# Patient Record
Sex: Female | Born: 1980 | Hispanic: Yes | Smoking: Never smoker
Health system: Southern US, Community
[De-identification: ages and names within clinical notes are randomized; demographics above are authoritative.]

## PROBLEM LIST (undated history)

## (undated) DIAGNOSIS — K219 Gastro-esophageal reflux disease without esophagitis: Secondary | ICD-10-CM

## (undated) DIAGNOSIS — D649 Anemia, unspecified: Secondary | ICD-10-CM

## (undated) HISTORY — DX: Gastro-esophageal reflux disease without esophagitis: K21.9

## (undated) HISTORY — PX: NO PAST SURGERIES: SHX2092

## (undated) HISTORY — DX: Anemia, unspecified: D64.9

---

## 2021-10-30 DIAGNOSIS — Z3A22 22 weeks gestation of pregnancy: Secondary | ICD-10-CM

## 2021-10-30 DIAGNOSIS — Z363 Encounter for antenatal screening for malformations: Secondary | ICD-10-CM

## 2021-10-30 DIAGNOSIS — O09522 Supervision of elderly multigravida, second trimester: Secondary | ICD-10-CM

## 2021-11-02 DIAGNOSIS — Z363 Encounter for antenatal screening for malformations: Secondary | ICD-10-CM | POA: Insufficient documentation

## 2021-11-02 DIAGNOSIS — O09522 Supervision of elderly multigravida, second trimester: Secondary | ICD-10-CM | POA: Insufficient documentation

## 2021-11-02 DIAGNOSIS — Z362 Encounter for other antenatal screening follow-up: Secondary | ICD-10-CM

## 2021-11-02 DIAGNOSIS — Z3689 Encounter for other specified antenatal screening: Secondary | ICD-10-CM

## 2021-11-02 DIAGNOSIS — O365921 Maternal care for other known or suspected poor fetal growth, second trimester, fetus 1: Secondary | ICD-10-CM

## 2021-11-02 DIAGNOSIS — Z3A22 22 weeks gestation of pregnancy: Secondary | ICD-10-CM | POA: Insufficient documentation

## 2021-11-02 NOTE — Progress Notes (Signed)
Name: Lisa Holloway Indication: Advanced Maternal Age  DOB: 1981/01/14 Age: 41 y.o.   EDC: 03/05/2022 LMP: 05/29/2021 Referring Provider:  Marcy Salvo*  EGA: [redacted]w[redacted]d Genetic Counselor: Teena Dunk, MS, CGC  OB Hx: [id number] Date of Appointment: 11/02/2021  Accompanied by: Spanish interpreter Face to Face Time: 30 Minutes   Previous Testing Completed: CBC from 10/23/2021 reviewed. MCV within normal limits. It is unlikely that Lisa Holloway is a beta thalassemia carrier or an alpha thalassemia carrier of the double-gene deletion. Individuals with a normal MCV may be single-gene deletion carriers, but it is unlikely that the current pregnancy would be affected with alpha or beta thalassemia major.  Lisa Holloway previously completed Non-Invasive Prenatal Screening (NIPS) in this pregnancy. The result is low risk. This screening significantly reduces the risk that the current pregnancy has Down syndrome, Trisomy 69, Trisomy 62, and common sex chromosome conditions, however, the risk is not zero given the limitations of NIPS. Additionally, there are many genetic conditions that cannot be detected by NIPS.    Medical & Family History:  This is Lisa Holloway's 3rd pregnancy. She has 2 living children from a previous partner. Reports she takes folic acid and prenatal vitamins. Denies personal history of diabetes, high blood pressure, thyroid conditions, and seizures. Denies bleeding, infections, and fevers in this pregnancy. Denies using tobacco, alcohol, or street drugs in this pregnancy. Maternal ethnicity reported as Latino and paternal ethnicity reported as Latino. Denies consanguinity. Lisa Holloway denied a family history of chromosome conditions, intellectual disability, autism, birth defects, bone/skeletal disorders, blindness, deafness, blood disorders, neuromuscular disorders, still births, early infant deaths, and 3 or more pregnancy losses for one person on her prenatal intake questionnaire.      Genetic  Counseling:   Advanced Maternal Age. With delivery at 40 years, Lisa Holloway's age-related risk to have a baby with Down syndrome is 1/133 and risk to have a baby with any chromosome condition is 1/70. Lisa Holloway previously completed Non-Invasive Prenatal Screening (NIPS) in this pregnancy. The result is low risk. This screening significantly reduces the risk that the current pregnancy has Down syndrome, Trisomy 86, Trisomy 38, and common sex chromosome conditions, however, the risk is not zero given the limitations of NIPS. Additionally, there are many genetic conditions that cannot be detected by NIPS. Given the limitations of NIPS genetic counseling offered Lisa Holloway the option of amniocentesis for prenatal diagnosis. Lisa Holloway declined amniocentesis for prenatal diagnosis at this time.   Birth Defects. All babies have approximately a 3-5% risk for a birth defect and a majority of these defects cannot be detected through the screening or diagnostic testing listed below. Ultrasound may detect some birth defects, but it may not detect all birth defects. About half of pregnancies with Down syndrome do not show any soft markers on ultrasound. A normal ultrasound does not guarantee a healthy pregnancy.    Testing/Screening Options:   Amniocentesis. This procedure is available for prenatal diagnosis. Possible procedural difficulties and complications that can arise include maternal infection, cramping, bleeding, fluid leakage, and/or pregnancy loss. The risk for pregnancy loss with an amniocentesis is 1/500. Per the [employer] of [employer]stetric[employer]necologists (ACOG) Practice Bulletin 162, all pregnant women should be offered prenatal assessment for aneuploidy by diagnostic testing regardless of maternal age or other risk factors. If indicated, genetic testing that could be ordered on an amniocentesis sample includes a fetal karyotype, fetal microarray, and testing for specific syndromes.  Carrier screening. Per the  ACOG Committee Opinion 691, all women who are considering a pregnancy  or are currently pregnant should be offered carrier screening for, at minimum, Cystic Fibrosis (CF), Spinal Muscular Atrophy (SMA), and Hemoglobinopathies. The mode of inheritance, clinical manifestations of these conditions, as well as details about testing were reviewed. A negative result on carrier screening reduces the likelihood of being a carrier, however, does not entirely rule out the possibility. If Lisa Holloway was found to be a carrier for a specific condition, carrier screening for their reproductive partner would be recommended.     Patient Plan:  Proceed with: Routine prenatal care. Lisa Holloway had carrier screening drawn through her OB office. The results are still pending through the laboratory [facility]. Informed consent was obtained. All questions were answered.  Declined: Amniocentesis for prenatal diagnosis   Thank you for sharing in the care of Lisa Holloway with [place].  Please do not hesitate to contact us if you have any questions.  Teena Dunk, MS, [facility]

## 2021-11-08 DIAGNOSIS — Z3490 Encounter for supervision of normal pregnancy, unspecified, unspecified trimester: Secondary | ICD-10-CM

## 2021-11-08 DIAGNOSIS — Z8759 Personal history of other complications of pregnancy, childbirth and the puerperium: Secondary | ICD-10-CM

## 2021-11-08 NOTE — Progress Notes (Signed)
°  Subjective:  Lisa Holloway is a [id number] [redacted]w[redacted]d being seen today for her first obstetrical visit - she was initially seen at the [facility] and transferred here due to prior pregnancy with SGA - last baby was born at 73 weeks (spontaneous labor) 4#8oz. Her last pregnancy was 15 years ago. Patient does intend to breast feed. Pregnancy history fully reviewed.  Patient reports no complaints.  BP 120/79    Pulse (!) 104    Wt 149 lb 6.4 oz (67.8 kg)    LMP 05/29/2021    BMI 30.11 kg/m   HISTORY: OB History  Gravida Para Term Preterm AB Living  3 2 2  0   2  SAB IAB Ectopic Multiple Live Births          2    # Outcome Date GA Lbr Len/2nd Weight Sex Delivery Anes PTL Lv  3 Current           2 Term 02/27/07 [redacted]w[redacted]d  4 lb 8 oz (2.041 kg) F Vag-Spont  Y   1 Term 09/02/03 [redacted]w[redacted]d  7 lb (3.175 kg) M Vag-Spont None N LIV    Past Medical History:  Diagnosis Date   Anemia    GERD (gastroesophageal reflux disease)     Past Surgical History:  Procedure Laterality Date   NO PAST SURGERIES      Family History  Problem Relation Age of Onset   Uterine cancer Mother    Hypertension Father      Exam  BP 120/79    Pulse (!) 104    Wt 149 lb 6.4 oz (67.8 kg)    LMP 05/29/2021    BMI 30.11 kg/m   Chaperone present during exam  CONSTITUTIONAL: Well-developed, well-nourished female in no acute distress.  HENT:  Normocephalic, atraumatic, External right and left ear normal. Oropharynx is clear and moist EYES: Conjunctivae and EOM are normal. Pupils are equal, round, and reactive to light. No scleral icterus.  NECK: Normal range of motion, supple, no masses.  Normal thyroid.  CARDIOVASCULAR: Normal heart rate noted, regular rhythm RESPIRATORY: Clear to auscultation bilaterally. Effort and breath sounds normal, no problems with respiration noted. ABDOMEN: Soft, normal bowel sounds, no distention noted.  No tenderness, rebound or guarding.  MUSCULOSKELETAL: Normal range of motion. No tenderness.  No  cyanosis, clubbing, or edema.  2+ distal pulses. SKIN: Skin is warm and dry. No rash noted. Not diaphoretic. No erythema. No pallor. NEUROLOGIC: Alert and oriented to person, place, and time. Normal reflexes, muscle tone coordination. No cranial nerve deficit noted. PSYCHIATRIC: Normal mood and affect. Normal behavior. Normal judgment and thought content.    Assessment:    Pregnancy: 05/31/2021 Patient Active Problem List   Diagnosis Date Noted   Supervision of low-risk pregnancy 11/08/2021      Plan:   1. Encounter for supervision of low-risk pregnancy, antepartum FHT and FH normal. Start ASA 81mg . - CHL AMB BABYSCRIPTS SCHEDULE OPTIMIZATION  2. History of prior pregnancy with SGA newborn 01/08/2022 q 4-6 weeks.    Problem list reviewed and updated. 75% of 30 min visit spent on counseling and coordination of care.     11/08/2021

## 2021-11-08 NOTE — Patient Instructions (Signed)
AREA PEDIATRIC/FAMILY PRACTICE PHYSICIANS ° °[place] ([address]) °[facility] °Chambliss, MD; Eniola, MD; Hale, MD; Hensel, MD; McDiarmid, MD; McIntyer, MD; Neal, MD; Walden, MD °[address] [place], [place] [address] °[phone] °Mon-Fri 8:30-12:30, 1:30-5:00 °Providers come to see babies at [facility] °Accepting Medicaid °[facility] °Limited providers who accept newborns: Koirala, MD; Morrow, MD; Wolters, MD °[address], [place], [place] [address] °[phone] °Mon-Fri 8:00-5:30 °Babies seen by providers at [facility] °Does NOT accept Medicaid °Please call early in hospitalization for appointment (limited availability)  °[facility] °Mulberry, MD °[address] [place], [place] [address] °[phone] °Mon, Tue, Thur, Fri 8:30-5:00, Wed 10:00-7:00 (closed 1-2pm) °Babies seen by [facility] providers °Accepting Medicaid °[facility] °Rubin, MD °[address], [place], [place] [address] °[phone] °Mon-Fri 8:30-5:00, Sat 8:30-12:00 °Provider comes to see babies at [facility] °Accepting Medicaid °Must have been referred from current patients or contacted office prior to delivery °[facility] ([facility]) °Brown, MD; Chandler, MD; Ettefagh, MD; Grant, MD; Lester, MD; McCormick, MD; McQueen, MD; Prose, MD; Simha, MD; Stanley, MD; Stryffeler, NP; Tebben, NP °[address], [place], [place] [address] °[phone] °Mon, Tue, Thur, Fri 8:30-5:30, Wed 9:30-5:30, Sat 8:30-12:30 °Babies seen by [facility] providers °Accepting Medicaid °Only accepting infants of first-time parents or siblings of current patients °Hospital discharge coordinator will make follow-up appointment °Jack Amos °[address], [place], [place]  [address] °[phone]   Fax - [phone] °[facility] °[address], [place], [place]  [address] °Phone - [phone]   Fax - [phone] °Shilpa Gosrani °[address], [place], [place]  [address] °[phone] ° °[place] ([address]) °[facility] °Bates, MD; Brassfield, MD; Cooper, Cox, MD; MD; Davis, MD; Dovico, MD; Ettefaugh, MD; Little, MD; Lowe, MD; Keiffer, MD; Melvin, MD; Sumner, MD; Williams, MD °[address], [place], [place] [address] °[phone] °Mon-Fri 8:30-5:00 (extended evenings Mon-Thur as needed), Sat-Sun 10:00-1:00 °Providers come to see babies at [facility] °Accepting Medicaid for families of first-time babies and families with all children in the household age 3 and under. Must register with office prior to making appointment (M-F only). °[facility] °Henson, NP; Knapp, MD; Lalonde, MD; Tysinger, PA °[address] [place], [place] [address] °[phone] °Mon-Fri 8:00-5:00 °Babies seen by providers at [facility] °Does NOT accept Medicaid/Commercial Insurance Only °[facility] ([facility]  °Artis, MD; Barnes, MD; Bratton, MD; Coccaro, MD; Lockett Gardner, MD; Kramer, MD; Marshall, MD; Netherton, MD; Poleto, MD; Skinner, MD °[address] [place], [place] [address] °[phone] °Mon-Fri 8:30-5:30, Sat (Oct.-Mar.) 9:00-1:00 °Babies seen by providers at [facility] °Accepting Medicaid ° °[place] ([address]) °[facility] °Reid, MD; Warner, MD °[address], [place], [place] [address] °[phone] °Mon-Fri 8:30-5:00, Sat 8:30-12:00 °Providers come to see babies at [facility] °Does NOT accept Medicaid °[facility] °Becker, PA; Hagler, MD; Scifres, PA; Sun, MD; Swayne, MD °[address], [place], [place] [address] °[phone] °Mon-Fri 8:00-5:00 °Babies seen by providers at [facility] °Does NOT accept Medicaid °Only accepting babies of parents who  are patients °Please call early in hospitalization for appointment (limited availability) °[facility] °Clark, MD; Frye, MD; Kelleher, MD; Mack, NP; Miller, MD; O'Keller, MD; Patterson, NP; Pudlo, MD; Puzio, MD; Thomas, MD; Tucker, MD; Twiselton, MD °[address], [place], [place] [address] °[phone] °Mon-Fri 8:00-5:00, Sat 9:00-12:00 °Providers come to see babies at [facility] °Does NOT accept Medicaid ° °[place] ([address]) °[facility] °Limited providers accepting new patients: Brake, NP; Wharton, PA °[address], [place], [place] [address] °[phone] °Mon-Fri 8:00-5:00 °Babies seen by providers at [facility] °Does NOT accept Medicaid °Only accepting babies of parents who are patients °Please call early in hospitalization for appointment (limited availability) °[facility] °Gay, MD; Quinlan, MD °[address] [place], [place] [address] °[phone] (press 1 to schedule appointment) °Mon-Fri 8:00-5:00 °Providers come to see babies at [facility] °Does NOT accept Medicaid °[facility] °Mazer, MD °[address] [place], [place] [address] °[phone] °Mon-Fri 8:30-5:00 (lunch 12:30-1:00), extended hours by appointment only Wed 5:00-6:30 °Babies seen by [facility] providers °Accepting Medicaid °[facility] °Banks, MD; Jordan, MD; Koberlein, MD °[address], [place], [place] [address] °[phone] °Mon-Fri 8:00-5:00 °Babies seen by [facility] providers °Does NOT accept Medicaid °[facility] °Parker, MD; Hunter, MD; Wallace, DO °[address] [place], [place] [address] °[phone] °Mon-Fri 8:00-5:00 °Babies seen by [facility] providers °Does NOT accept Medicaid °[facility] °Brandon, PA; Brecken, PA; Christy, NP; Dees, MD; DeClaire, MD; DeWeese, MD; Hansen, NP; Mills, NP; Parrish, NP; Smoot, NP; Summer, MD; Vapne,  MD °[address] [place], [place] [address] °[phone] °Mon-Fri 8:30-5:00, Sat 10:00-1:00 °Providers come to see babies at [facility] °Does NOT accept Medicaid °Free prenatal information session Tuesdays at 4:45pm °[facility] °Bouska, MD; Gordon, PA; Jeffery, PA; Weber, PA °[address] [place] [address] °[phone] °Mon-Fri 7:30-5:30 °Babies seen by [facility] providers °[facility] °[address], [place], [place]  [address] °[phone]   Fax - [phone] ° °[place] ([address]) °[facility] °Reese, MD °[address] [place], [place] [address] °[phone] °Mon-Thur 8:00-6:00 °Providers come to see babies at [facility] °Accepting Medicaid °[facility] °Anderson, NP; Badger, MD; Beal, PA; Spencer, PA °[address] [place], [place] [address] °[phone] °Mon-Thur 7:30-7:30, Fri 7:30-4:30 °Babies seen by [facility] providers °Accepting Medicaid °[facility] °Agbuya, MD; Klett, NP; Romgoolam, MD °[address], [place], [place] [address] °[phone] °Mon-Fri 8:30-5:00, Sat 8:30-12:00 °Providers come to see babies at [facility] °Accepting Medicaid °Must have “Meet & Greet” appointment at office prior to delivery °[facility] ([facility]) °McCord, MD; Wallace, MD; Wood, MD °[address], [place], [place] [address] °[phone] °Mon-Wed 8:00-6:00, Thur-Fri 8:00-5:00, Sat 9:00-12:00 °Providers come to see babies at [facility] °Does NOT accept Medicaid °Only accepting siblings of current patients °[facility]  °[address], [place], [place]  [address] °[phone]   Fax - [phone] °[facility] °[address], [place], [place]  [address] °[phone]   Fax -  [phone] ° °[place] ([address]) ° [facility] °Cirigliano, DO; Matthews, DO °[address] [place], [place] [address] °[phone] °Mon-Fri 7:00-5:00 °Babies seen by [facility] providers °Does NOT accept Medicaid °[facility] °Briscoe, MD; Howley, PA; Moreira, PA °[address], [place],  [address] °[phone] °Mon-Fri 8:00-5:00 °Babies seen by [facility] providers °Accepting Medicaid °[facility] °Boyd, MD; Church, PA; Jones, NP; Osborn, PA °[address], ,  [address] °[phone] °Mon-Fri 8:00-5:00 °Babies seen by providers at [facility] °Accepting Medicaid ° °[place] ([address]) °[facility] [place] °Wendling, DO °[address] [place], [place] [address] °[phone] °Mon-Fri 8:00-5:00 °Babies seen by [facility] providers °Does NOT accept Medicaid °Limited availability, please call early in hospitalization to schedule follow-up °[facility] °Calderon, PA; Cummings, MD; Dillard, MD; Martin, PA; Olson, MD; VanDeven, PA °[address], [place], [place] [address] °[phone] °Mon-Fri 8:30-5:00, Sat 9:00-12:00 °Babies seen by providers at [facility] °Accepting Medicaid °Please register online then schedule online or call office °[email or web address] °[facility] ([facility] °Hunter, NP; Kumar, MD; Martin Rogers, PA °[address], [place], [place] [address] °[phone] °Mon-Fri 8:00-5:00 °Babies seen by providers at [facility] °Accepting Medicaid °[facility] ([facility]) °Wanamie, MD; Kristi Fleenor, NP; West, MD °[address], [place], [place] [address] °[phone] °Mon-Fri 8:00-5:30, Sat&Sun by appointment (phones open at  8:30) °Babies seen by [facility] providers °Accepting Medicaid °Must be a first-time baby or sibling of current patient °[facility] [place]  °[address], [place], [place]  [address] °[phone]   Fax - [phone] ° °[place] ([address]) °[facility] °Brown, PA; Cowen, PA; Rice, MD; Helton, PA; Spry, MD °[address] [place], [place] [address] °[phone] °Mon-Thur 8:00-7:00, Fri 8:00-5:00, Sat 8:00-12:00, Sun 9:00-12:00 °Babies seen by [facility] providers °Accepting Medicaid °[facility] °Coe-Goins, MD; Marshall, MD; Pierre-Louis, MD °[address], [place], [place] [address] °[phone] °Mon-Thur 8:00-5:00 °Babies seen by providers at [facility] °Accepting Medicaid °[facility] °Bratton, MD; Coe-Goins, MD; Hayes, MD; Lewis, MD; List, MD; Lott, MD; Marshall, MD; Moran, MD; O'Neal, MD; Pierre-Louis, MD; Pitonzo, MD; Scholer, MD; Spangle, MD °[address] [place], [place] [address] °[phone] °Mon-Fri 8:00-5:30, Sat (Oct.-Mar.) 9:00-1:00 °Babies seen by providers at [facility] °Accepting Medicaid °Must fill out new patient packet, available online at [email or web address] °[facility] ([facility]) °Friddle, NP; Harris, NP; Kelly, NP; Logan, MD; Melvin, PA; Poth, MD; Ramadoss, MD; Stanton, NP °[address], [place], [place] [address] °[phone] °Mon-Thur 8:00-5:30, Fri 8:00-5:00 °Babies seen by providers at [facility] °Accepting Medicaid ° °[place] ([address]) °[facility] °Dixon, PA; Eldridge, MD; Pickard, MD; Tapia, PA °[address], [place], [place] [address] °[phone] °Mon-Fri 8:00-5:00 °Babies seen by providers at [facility] °Accepting Medicaid  ° °[place] ([address]) °[facility] [place] °Masneri, DO; Meyers, MD; Nelson, PA °[address], [place], [place] [address] °[phone] °Mon-Fri 8:00-5:00 °Babies seen by providers at [facility] °Does NOT accept Medicaid °Limited appointment availability, please call early in hospitalization ° °[facility] [place] °Kunedd, DO; McGowen, MD °[address], [place], [place] [address] °[phone] °Mon-Fri 8:00-5:00 °Babies seen by [facility] providers °Does NOT accept Medicaid °[facility] [place] °Cameron, MD; MacDonald, MD; Michaels, PA; Nayak, MD °[address] [place] [address], [place], [place] [address] °[phone] °Mon-Fri 8:00-5:00 °After hours clinic ([address] [place], [place] [address]) [phone] Mon-Fri 5:00-8:00, Sat 12:00-6:00, Sun 10:00-4:00 °Babies seen by [facility] providers °Accepting Medicaid °[facility] [place] °[address], [place], [place]  [address] °[phone]   Fax - [phone] ° °[place] ([address]) °[facility] [place] [facility] °Andy, MD °[address], [place], [place] [address] °[phone] °Mon-Fri 8:00-5:00 °Babies seen by [facility] providers °Does NOT accept Medicaid °[facility] [place] ([facility] [place]) °Eksir, MD °[address], [place], [place] [address] °[phone] °Mon-Thur 8:00-7:00, Fri 8:00-5:00, Sat 8:00-12:00 °Babies seen by providers at [facility] °Accepting Medicaid - but does not have vaccinations in office (must be received elsewhere) °Limited availability, please call early in hospitalization ° °[place] ([address]) °[facility]  °Charlene Flemming, MD °[address], [place] [address] °[phone]  Fax [phone] ° °[place] °[facility]  °[facility]  °Kimberly Newton, MD, Annamarie Streilein, PA, Carla Hampton, PA °[address] [place] °[place], [place]  [address] °[phone] °[facility]  °[address], [place], [place] [address] °[phone] °[address], [place], [place] [address] °[phone] (West Office)  °[facility] °[address], [place], [place] [address] °[phone] °[facility] °[address] [place], [place], [place] [address] °[phone] °[facility] °[address], [place], [place] [address] °[phone] °[facility] °[address], [place], [place] [address] °[phone] °[facility] °[address], [place], [place] [address] °[phone] °[facility] °[address], [place], [place] [address] °[phone] °[facility]  °[address], [place], [place] [address] °[phone] °Dr. Robert W. Little °[address], [place], [place] [address] °[phone] °[facility] °[address], [place], [place] [address] °[phone] °[facility] °[address], [place], [place] [address] °[phone]  °

## 2023-03-13 IMAGING — US US MFM OB FOLLOW-UP
1 series · 13 of 28 positions shown · non-contrast
Comparison: none

[Series 1: us mfm ob follow-up · 13 of 52 slices shown]
[im 2/52]
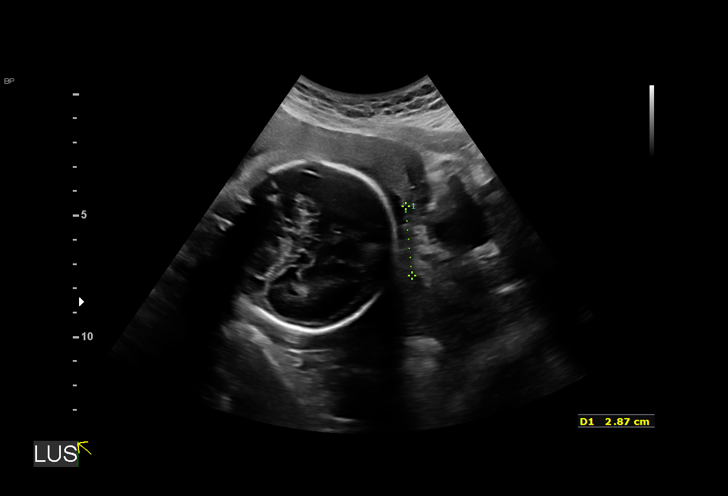
[im 6/52]
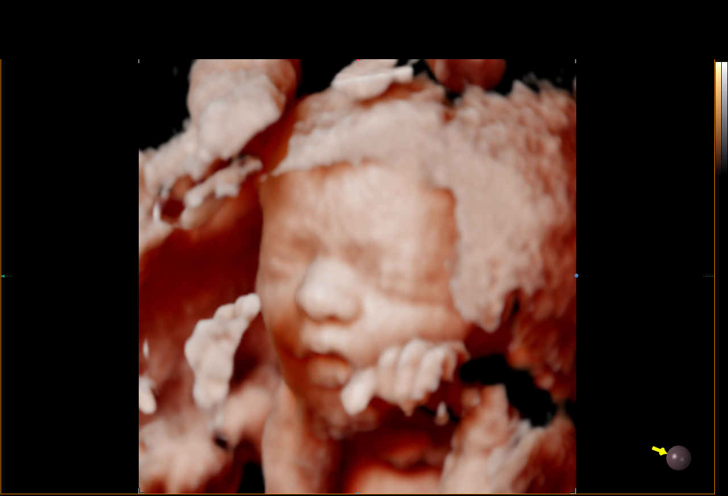
[im 10/52]
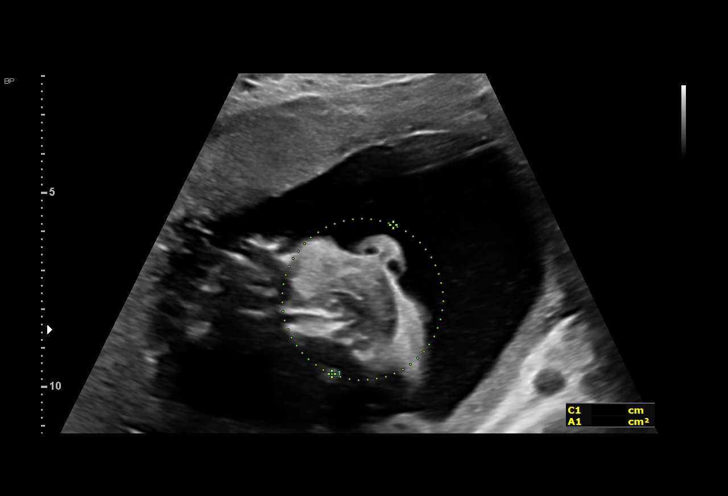
[im 14/52]
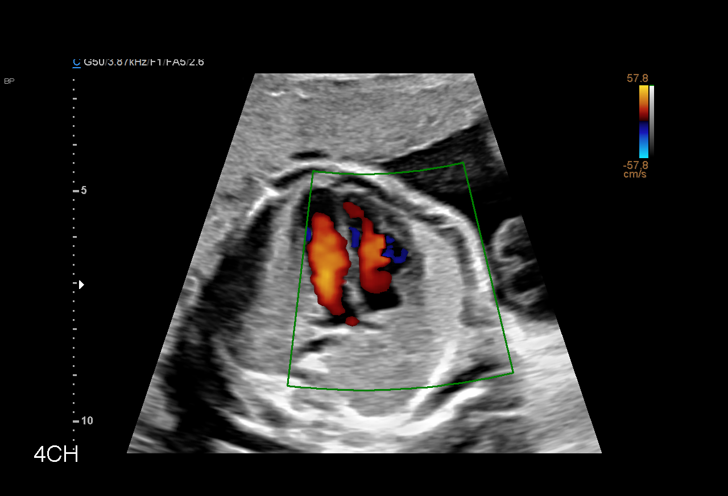
[im 18/52]
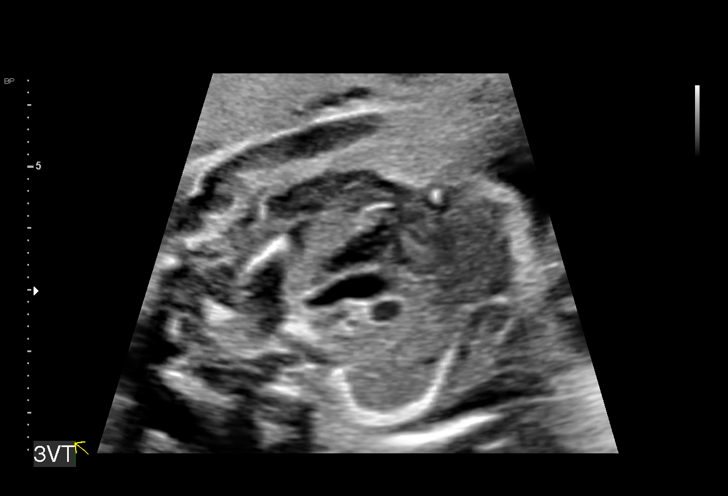
[im 21/52]
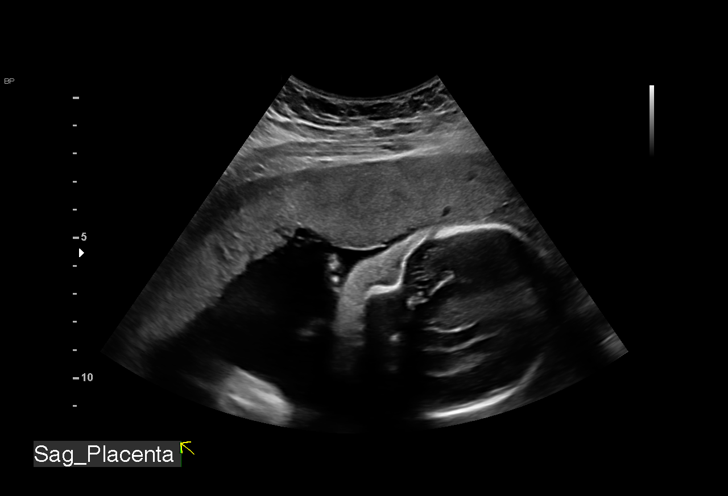
[im 27/52]
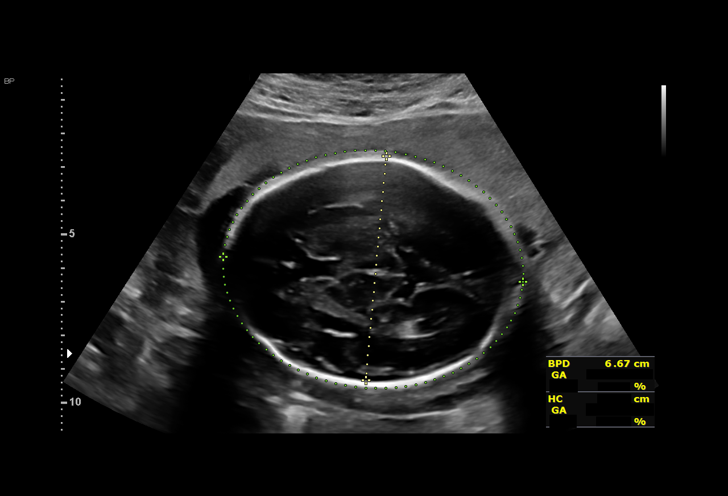
[im 31/52]
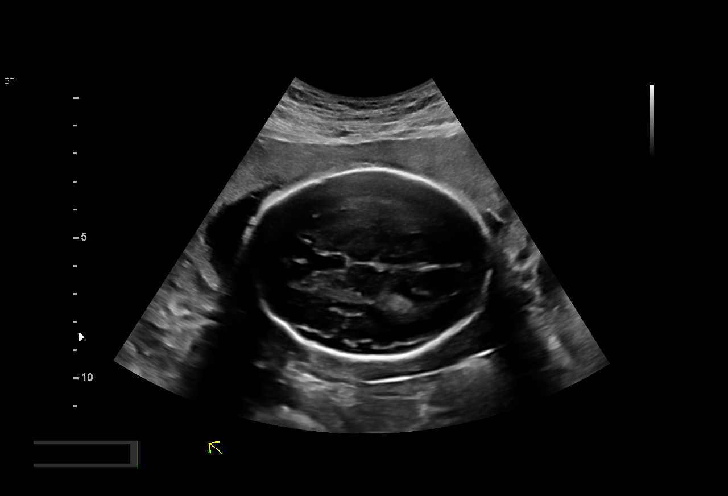
[im 35/52]
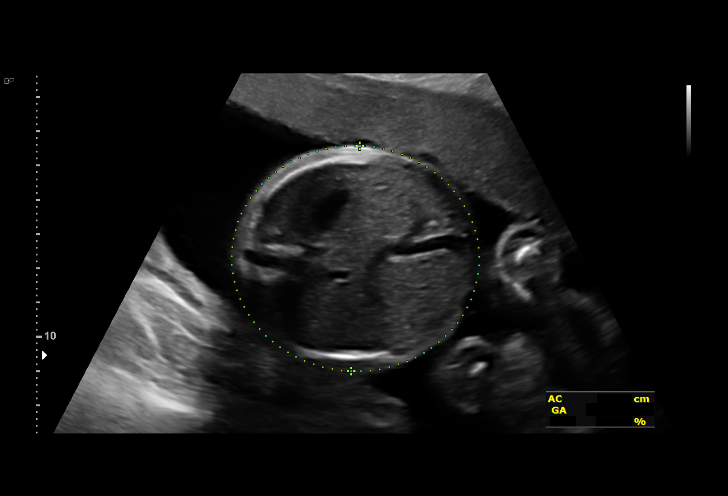
[im 38/52]
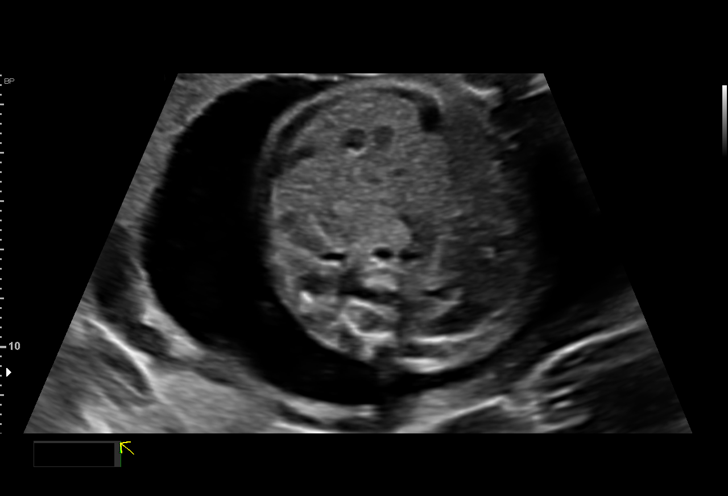
[im 42/52]
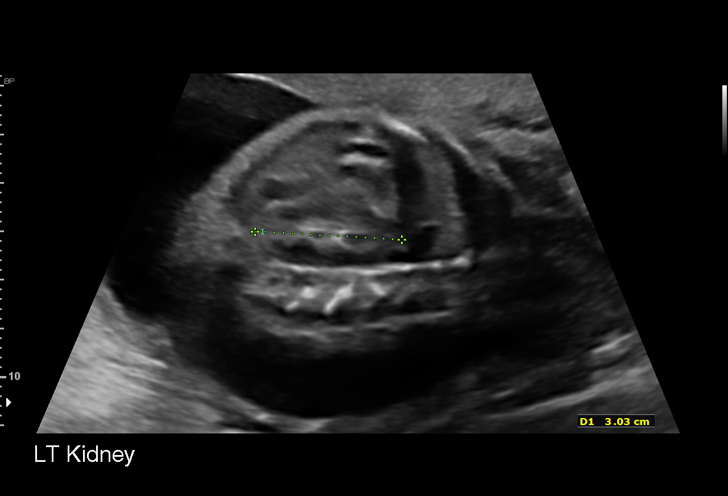
[im 46/52]
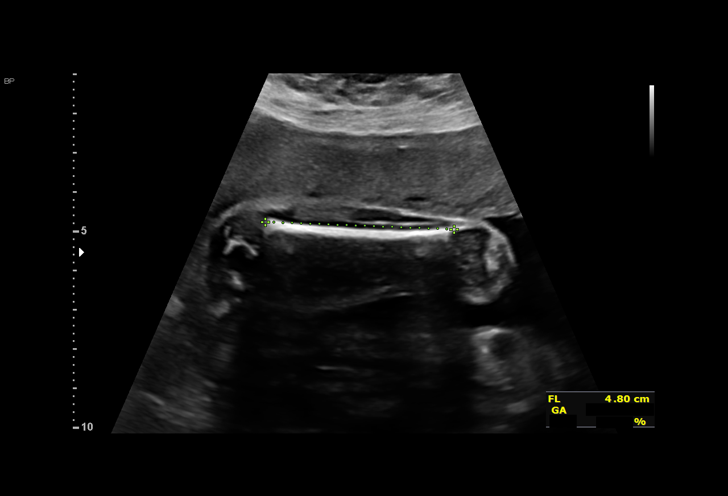
[im 50/52]
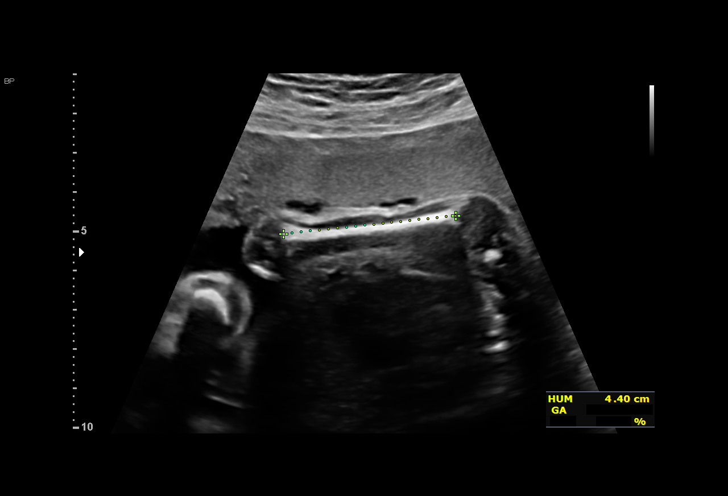

[13 of 28 positions shown; findings below may reference images not displayed]

Indications

 Advanced maternal age multigravida 35+,
 second trimester (40 y.o)
 Poor obstetric history: Previous fetal growth
 restriction (FGR)
 LR NIPS
 26 weeks gestation of pregnancy
Fetal Evaluation

 Num Of Fetuses:         1
 Fetal Heart Rate(bpm):  153
 Cardiac Activity:       Observed
 Presentation:           Cephalic
 Placenta:               Anterior
 P. Cord Insertion:      Visualized, central

 Amniotic Fluid
 AFI FV:      Within normal limits

                             Largest Pocket(cm)

Biometry

 BPD:      66.9  mm     G. Age:  27w 0d         58  %    CI:        71.76   %    70 - 86
                                                         FL/HC:      19.1   %    18.6 -
 HC:      251.4  mm     G. Age:  27w 2d         54  %    HC/AC:      1.17        1.04 -
 AC:      215.2  mm     G. Age:  26w 0d         29  %    FL/BPD:     71.9   %    71 - 87
 FL:       48.1  mm     G. Age:  26w 1d         26  %    FL/AC:      22.4   %    20 - 24
 HUM:      43.4  mm     G. Age:  25w 6d         34  %
 CER:      29.2  mm     G. Age:  25w 4d         36  %

 LV:        3.5  mm
 CM:        5.1  mm

 Est. FW:     908  gm           2 lb     30  %
OB History

 Gravidity:    3         Term:   2        Prem:   0        SAB:   0
 TOP:          0       Ectopic:  0        Living: 2
Gestational Age

 LMP:           26w 3d        Date:  05/29/21                  EDD:   03/05/22
 U/S Today:     26w 4d                                        EDD:   03/04/22
 Best:          26w 3d     Det. By:  LMP  (05/29/21)          EDD:   03/05/22
Anatomy

 Cranium:               Appears normal         Aortic Arch:            Previously seen
 Cavum:                 Appears normal         Ductal Arch:            Previously seen
 Ventricles:            Appears normal         Diaphragm:              Appears normal
 Choroid Plexus:        Previously seen        Stomach:                Appears normal, left
                                                                       sided
 Cerebellum:            Appears normal         Abdomen:                Previously seen
 Posterior Fossa:       Appears normal         Abdominal Wall:         Previously seen
 Nuchal Fold:           Not applicable (>20    Cord Vessels:           Appears normal (3
                        wks GA)                                        vessel cord)
 Face:                  Appears normal         Kidneys:                Appear normal
                        (orbits and profile)
 Lips:                  Appears normal         Bladder:                Appears normal
 Thoracic:              Appears normal         Spine:                  Previously seen
 Heart:                 Appears normal         Upper Extremities:      Previously seen
                        (4CH, axis, and
                        situs)
 RVOT:                  Appears normal         Lower Extremities:      Previously seen
 LVOT:                  Appears normal

 Other:  3VV and 3VTV visualized. Nasal bone, lenses, maxilla, mandible,
         falx, heels/feet, open hands/5th digits, VC, 3VV and 3VTV previously
         visualized.
Cervix Uterus Adnexa

 Cervix
 Not visualized (advanced GA >14wks)

 Uterus
 Normal shape and size.

 Right Ovary
 Within normal limits.

 Left Ovary
 Within normal limits.
Impression
 Patient returned for completion of fetal anatomy .Amniotic
 fluid is normal and good fetal activity is seen .Fetal biometry
 is consistent with her previously-established dates .Fetal
 anatomical survey was completed and appears normal.
Recommendations

 -An appointment was made for her to return in 4 weeks for
 fetal growth assessment (history of fetal growth restriction).
                Banh, [place]

## 2023-05-15 IMAGING — US US MFM OB FOLLOW-UP
1 series · 13 of 28 positions shown · non-contrast
Comparison: none

[Series 1: us mfm ob follow-up · 48 acquisitions, 13 frames shown]
[im 2/48]
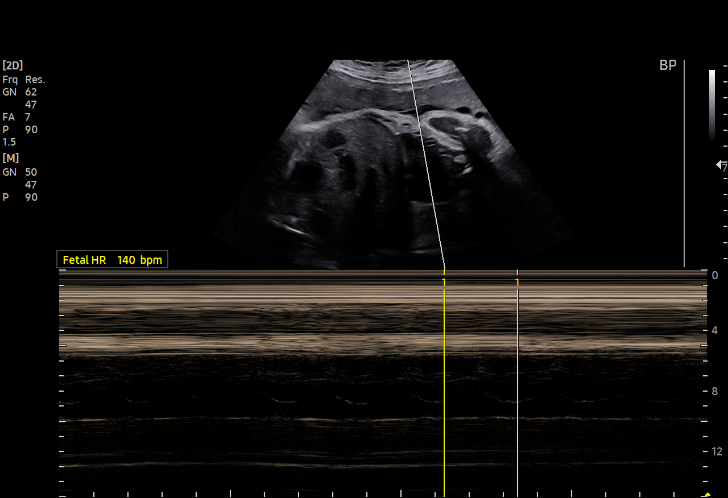
[im 6/48]
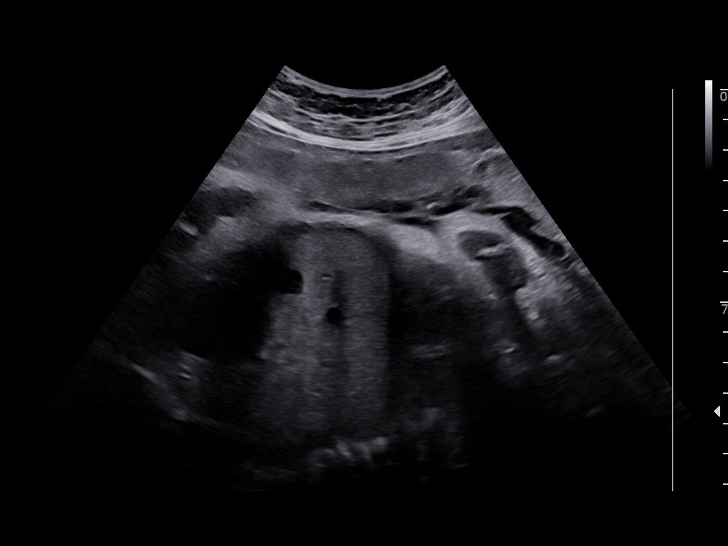
[im 9/48]
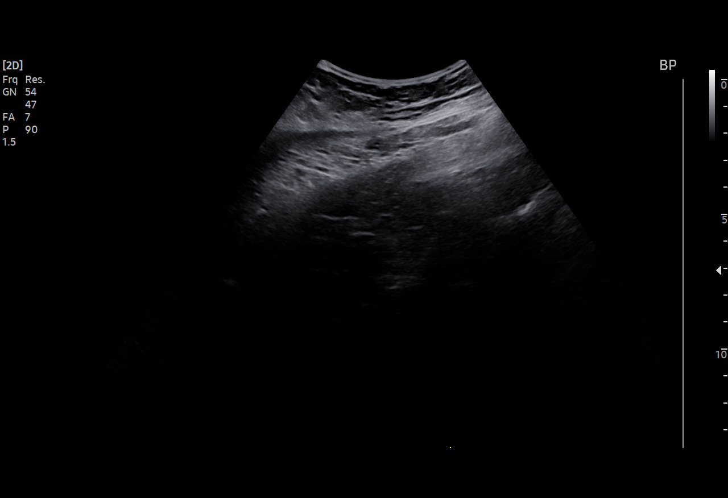
[im 13/48]
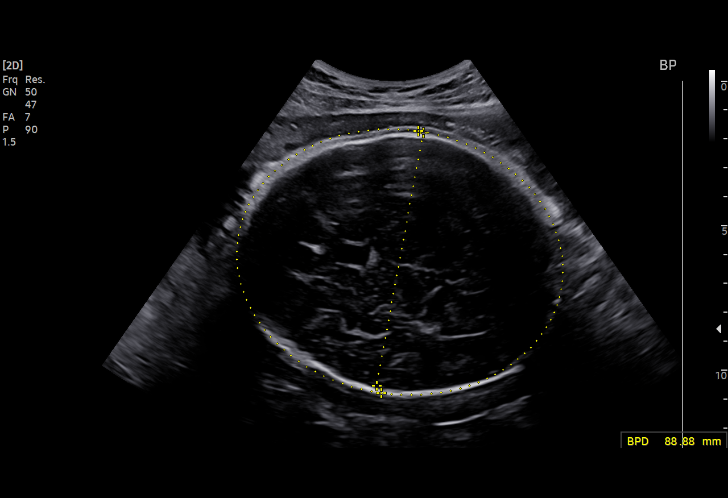
[im 16/48]
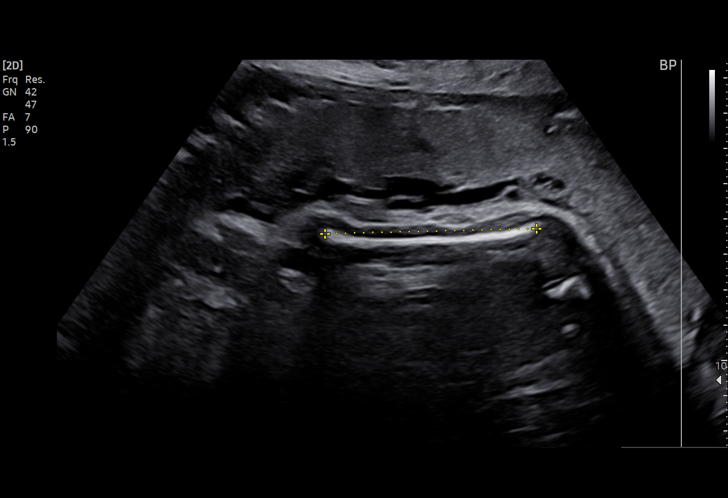
[im 20/48]
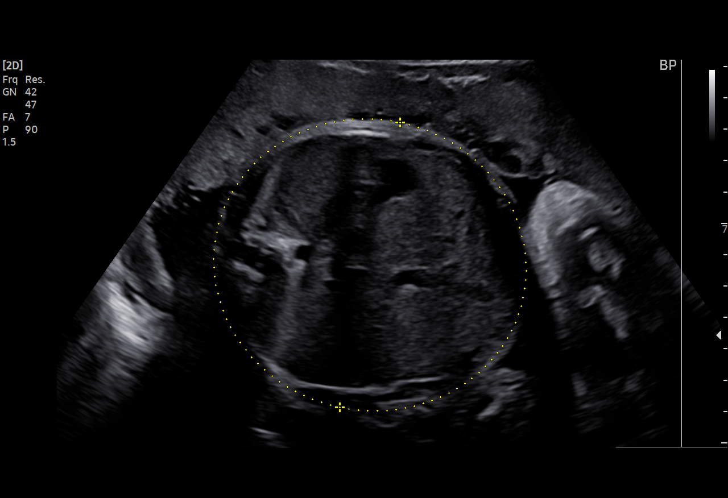
[im 25/48]
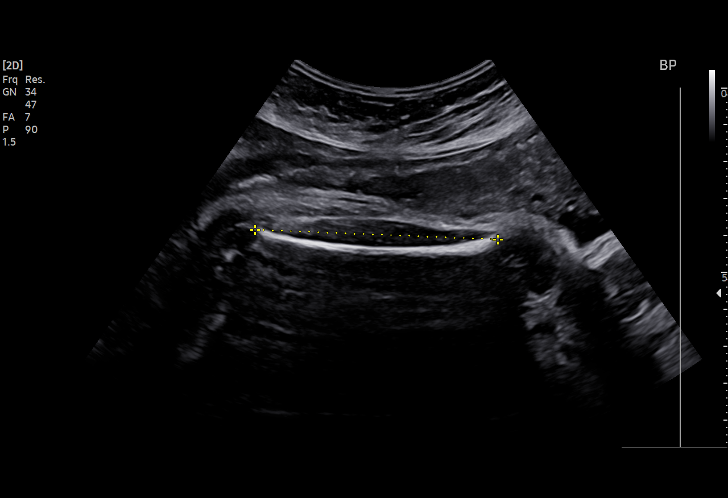
[im 28/48]
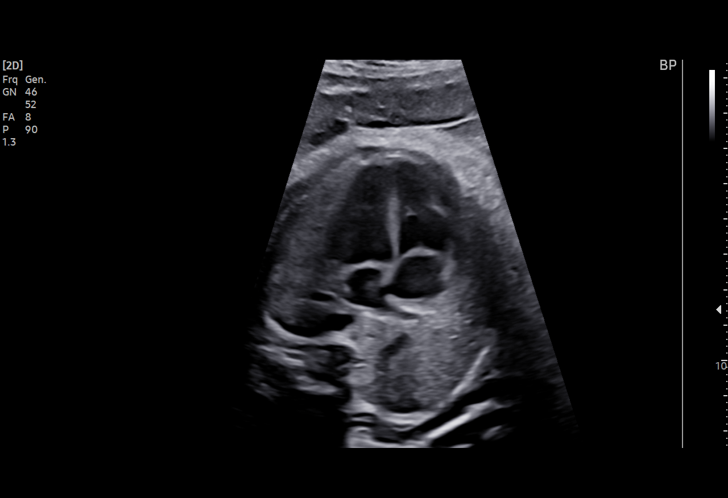
[im 32/48]
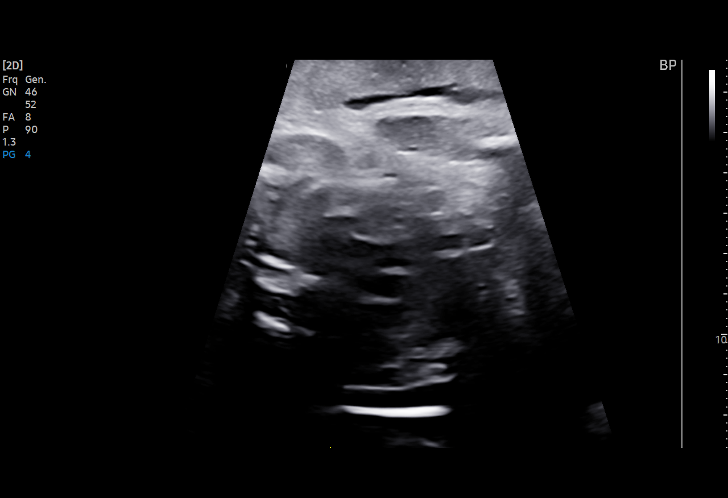
[im 35/48]
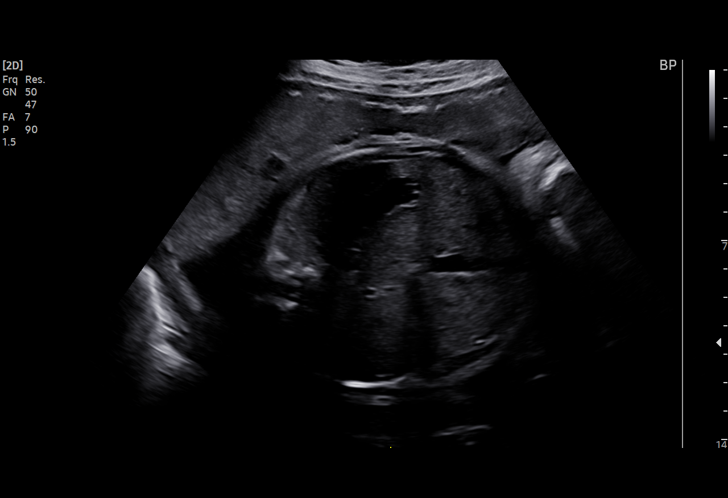
[im 39/48]
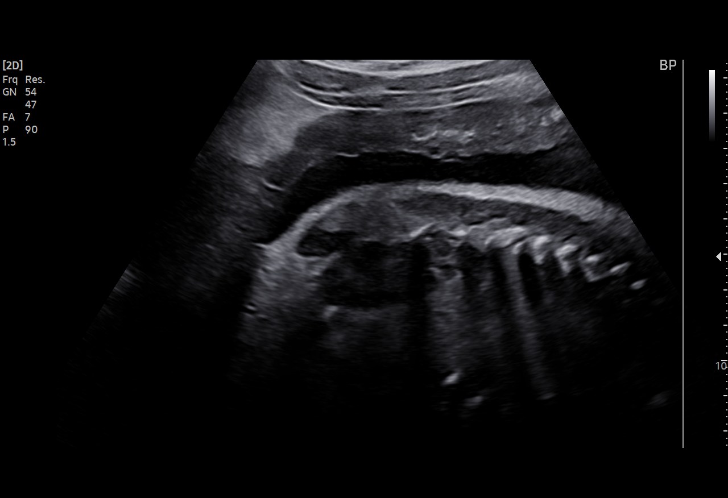
[im 42/48]
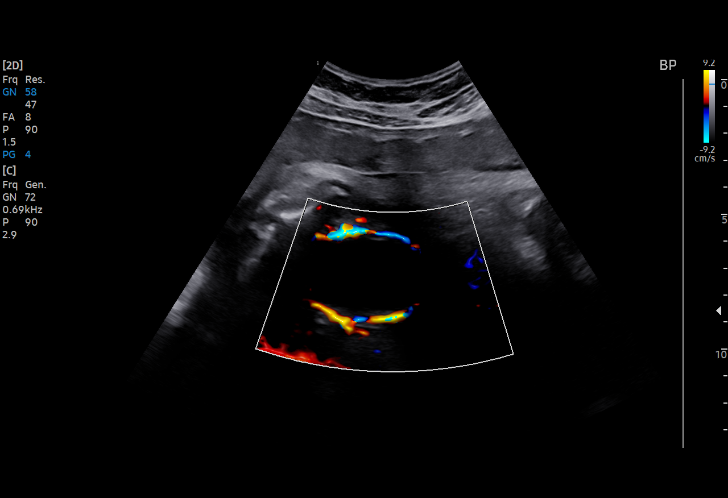
[im 46/48]
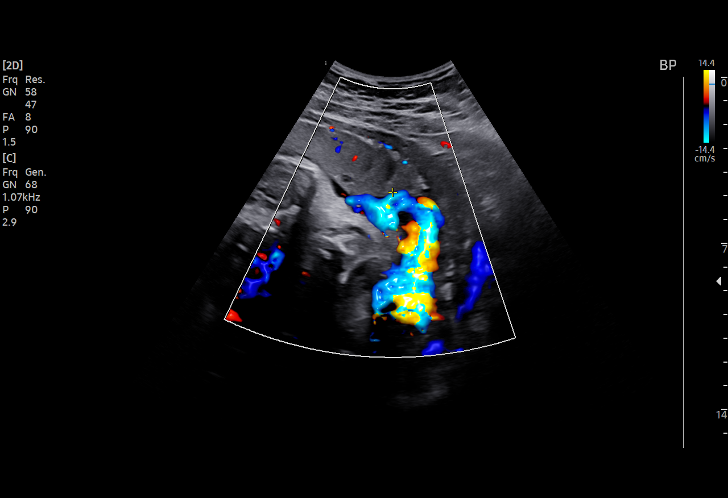

[13 of 28 positions shown; findings below may reference images not displayed]

Indications

 Advanced maternal age multigravida 35+,
 third trimester (40 y.o)
 Poor obstetric history: Previous fetal growth
 restriction (FGR)
 LR NIPS
 35 weeks gestation of pregnancy
Vital Signs

 BP:          108/66
Fetal Evaluation

 Num Of Fetuses:         1
 Fetal Heart Rate(bpm):  140
 Cardiac Activity:       Observed
 Presentation:           Cephalic
 Placenta:               Anterior
 P. Cord Insertion:      Previously Visualized

 Amniotic Fluid
 AFI FV:      Within normal limits

 AFI Sum(cm)     %Tile       Largest Pocket(cm)
 11.61           33

 RUQ(cm)       RLQ(cm)       LUQ(cm)        LLQ(cm)
 5.47          4.39          1.75           0
Biometry

 BPD:      89.2  mm     G. Age:  36w 1d         73  %    CI:        75.68   %    70 - 86
                                                         FL/HC:      20.2   %    20.1 -
 HC:    325.09   mm     G. Age:  36w 6d         52  %    HC/AC:      1.07        0.93 -
 AC:    304.85   mm     G. Age:  34w 3d         29  %    FL/BPD:     73.7   %    71 - 87
 FL:      65.72  mm     G. Age:  33w 6d         10  %    FL/AC:      21.6   %    20 - 24
 HUM:      59.3  mm     G. Age:  34w 2d         44  %

 LV:        3.3  mm

 Est. FW:    5580  gm      5 lb 8 oz     28  %
OB History

 Gravidity:    3         Term:   2        Prem:   0        SAB:   0
 TOP:          0       Ectopic:  0        Living: 2
Gestational Age

 LMP:           35w 3d        Date:  05/29/21                 EDD:   03/05/22
 U/S Today:     35w 2d                                        EDD:   03/06/22
 Best:          35w 3d     Det. By:  LMP  (05/29/21)          EDD:   03/05/22
Anatomy

 Cranium:               Appears normal         Aortic Arch:            Previously seen
 Cavum:                 Appears normal         Ductal Arch:            Previously seen
 Ventricles:            Appears normal         Diaphragm:              Appears normal
 Choroid Plexus:        Previously seen        Stomach:                Appears normal, left
                                                                       sided
 Cerebellum:            Previously seen        Abdomen:                Previously seen
 Posterior Fossa:       Previously seen        Abdominal Wall:         Previously seen
 Nuchal Fold:           Not applicable (>20    Cord Vessels:           Previously seen
                        wks GA)
 Face:                  Orbits and profile     Kidneys:                Appear normal
                        previously seen
 Lips:                  Previously seen        Bladder:                Appears normal
 Thoracic:              Appears normal         Spine:                  Previously seen
 Heart:                 Appears normal         Upper Extremities:      Previously seen
                        (4CH, axis, and
                        situs)
 RVOT:                  Previously seen        Lower Extremities:      Previously seen
 LVOT:                  Appears normal

 Other:  Nasal bone, lenses, maxilla, mandible, falx, heels/feet, open
         hands/5th digits, VC, 3VV and 3VTV previously visualized.
Cervix Uterus Adnexa

 Cervix
 Not visualized (advanced GA >72wks)

 Uterus
 Normal shape and size.

 Right Ovary
 Not visualized.
 Left Ovary
 Not visualized.
Comments

 This patient was seen for a follow up growth scan due to
 advanced matenal age. She denies any problems since her
 last exam.
 She was informed that the fetal growth and amniotic fluid
 level appears appropriate for her gestational age.
 As the fetal growth is within normal limits, no further exams
 were scheduled in our office.
